# Patient Record
Sex: Female | Born: 1955 | Race: Black or African American | Hispanic: No | Marital: Married | Smoking: Former smoker
Health system: Southern US, Community
[De-identification: ages and names within clinical notes are randomized; demographics above are authoritative.]

## PROBLEM LIST (undated history)

## (undated) DIAGNOSIS — K219 Gastro-esophageal reflux disease without esophagitis: Secondary | ICD-10-CM

## (undated) DIAGNOSIS — E7800 Pure hypercholesterolemia, unspecified: Secondary | ICD-10-CM

## (undated) HISTORY — PX: CHOLECYSTECTOMY: SHX55

## (undated) HISTORY — PX: FOOT SURGERY: SHX648

---

## 1997-12-14 ENCOUNTER — Other Ambulatory Visit: Admission: RE | Admit: 1997-12-14 | Discharge: 1997-12-14 | Payer: Self-pay | Admitting: Gynecology

## 1998-12-18 ENCOUNTER — Ambulatory Visit (HOSPITAL_COMMUNITY): Admission: RE | Admit: 1998-12-18 | Discharge: 1998-12-18 | Payer: Self-pay | Admitting: Family Medicine

## 1998-12-18 ENCOUNTER — Encounter: Payer: Self-pay | Admitting: Family Medicine

## 1999-01-01 ENCOUNTER — Ambulatory Visit (HOSPITAL_COMMUNITY): Admission: RE | Admit: 1999-01-01 | Discharge: 1999-01-01 | Payer: Self-pay | Admitting: Gastroenterology

## 1999-10-01 ENCOUNTER — Other Ambulatory Visit: Admission: RE | Admit: 1999-10-01 | Discharge: 1999-10-01 | Payer: Self-pay | Admitting: Gynecology

## 2000-04-07 ENCOUNTER — Encounter: Admission: RE | Admit: 2000-04-07 | Discharge: 2000-04-07 | Payer: Self-pay | Admitting: Gynecology

## 2000-04-07 ENCOUNTER — Encounter: Payer: Self-pay | Admitting: Gynecology

## 2000-10-19 ENCOUNTER — Encounter (INDEPENDENT_AMBULATORY_CARE_PROVIDER_SITE_OTHER): Payer: Self-pay | Admitting: Specialist

## 2000-10-19 ENCOUNTER — Other Ambulatory Visit: Admission: RE | Admit: 2000-10-19 | Discharge: 2000-10-19 | Payer: Self-pay | Admitting: Gynecology

## 2001-08-10 ENCOUNTER — Encounter: Admission: RE | Admit: 2001-08-10 | Discharge: 2001-08-10 | Payer: Self-pay | Admitting: Gynecology

## 2001-08-10 ENCOUNTER — Encounter: Payer: Self-pay | Admitting: Gynecology

## 2002-09-18 ENCOUNTER — Other Ambulatory Visit: Admission: RE | Admit: 2002-09-18 | Discharge: 2002-09-18 | Payer: Self-pay | Admitting: Gynecology

## 2002-09-26 ENCOUNTER — Encounter: Admission: RE | Admit: 2002-09-26 | Discharge: 2002-09-26 | Payer: Self-pay | Admitting: Gynecology

## 2002-09-26 ENCOUNTER — Encounter: Payer: Self-pay | Admitting: Gynecology

## 2002-11-27 ENCOUNTER — Ambulatory Visit: Admission: RE | Admit: 2002-11-27 | Discharge: 2002-11-27 | Payer: Self-pay | Admitting: Family Medicine

## 2002-11-27 ENCOUNTER — Encounter: Payer: Self-pay | Admitting: Family Medicine

## 2003-10-30 ENCOUNTER — Other Ambulatory Visit: Admission: RE | Admit: 2003-10-30 | Discharge: 2003-10-30 | Payer: Self-pay | Admitting: Gynecology

## 2003-10-30 ENCOUNTER — Encounter: Admission: RE | Admit: 2003-10-30 | Discharge: 2003-10-30 | Payer: Self-pay | Admitting: Gynecology

## 2004-12-16 ENCOUNTER — Other Ambulatory Visit: Admission: RE | Admit: 2004-12-16 | Discharge: 2004-12-16 | Payer: Self-pay | Admitting: Gynecology

## 2004-12-18 ENCOUNTER — Encounter: Admission: RE | Admit: 2004-12-18 | Discharge: 2004-12-18 | Payer: Self-pay | Admitting: Gynecology

## 2005-12-08 ENCOUNTER — Other Ambulatory Visit: Admission: RE | Admit: 2005-12-08 | Discharge: 2005-12-08 | Payer: Self-pay | Admitting: Gynecology

## 2005-12-23 ENCOUNTER — Encounter: Admission: RE | Admit: 2005-12-23 | Discharge: 2005-12-23 | Payer: Self-pay | Admitting: Gynecology

## 2006-12-22 ENCOUNTER — Other Ambulatory Visit: Admission: RE | Admit: 2006-12-22 | Discharge: 2006-12-22 | Payer: Self-pay | Admitting: Gynecology

## 2006-12-23 ENCOUNTER — Emergency Department (HOSPITAL_COMMUNITY): Admission: EM | Admit: 2006-12-23 | Discharge: 2006-12-23 | Payer: Self-pay | Admitting: Emergency Medicine

## 2007-01-28 ENCOUNTER — Encounter: Admission: RE | Admit: 2007-01-28 | Discharge: 2007-01-28 | Payer: Self-pay | Admitting: Gynecology

## 2008-03-13 ENCOUNTER — Other Ambulatory Visit: Admission: RE | Admit: 2008-03-13 | Discharge: 2008-03-13 | Payer: Self-pay | Admitting: Gynecology

## 2008-03-15 ENCOUNTER — Encounter: Admission: RE | Admit: 2008-03-15 | Discharge: 2008-03-15 | Payer: Self-pay | Admitting: Gynecology

## 2008-03-28 ENCOUNTER — Encounter: Admission: RE | Admit: 2008-03-28 | Discharge: 2008-03-28 | Payer: Self-pay | Admitting: Gynecology

## 2009-03-19 ENCOUNTER — Encounter: Admission: RE | Admit: 2009-03-19 | Discharge: 2009-03-19 | Payer: Self-pay | Admitting: Gynecology

## 2010-04-01 ENCOUNTER — Encounter: Admission: RE | Admit: 2010-04-01 | Discharge: 2010-04-01 | Payer: Self-pay | Admitting: Gynecology

## 2011-03-06 ENCOUNTER — Other Ambulatory Visit: Payer: Self-pay | Admitting: Gynecology

## 2011-03-06 DIAGNOSIS — Z1231 Encounter for screening mammogram for malignant neoplasm of breast: Secondary | ICD-10-CM

## 2011-04-06 ENCOUNTER — Ambulatory Visit
Admission: RE | Admit: 2011-04-06 | Discharge: 2011-04-06 | Disposition: A | Discharge status: Home / Self Care | Payer: BC Managed Care – PPO | Source: Ambulatory Visit | Attending: Gynecology | Admitting: Gynecology

## 2011-04-06 DIAGNOSIS — Z1231 Encounter for screening mammogram for malignant neoplasm of breast: Secondary | ICD-10-CM

## 2012-03-04 ENCOUNTER — Other Ambulatory Visit: Payer: Self-pay | Admitting: Gynecology

## 2012-03-04 DIAGNOSIS — Z1231 Encounter for screening mammogram for malignant neoplasm of breast: Secondary | ICD-10-CM

## 2012-04-06 ENCOUNTER — Other Ambulatory Visit: Payer: Self-pay | Admitting: Gynecology

## 2012-04-06 ENCOUNTER — Ambulatory Visit
Admission: RE | Admit: 2012-04-06 | Discharge: 2012-04-06 | Disposition: A | Discharge status: Home / Self Care | Payer: BC Managed Care – PPO | Source: Ambulatory Visit | Attending: Gynecology | Admitting: Gynecology

## 2012-04-06 DIAGNOSIS — Z1231 Encounter for screening mammogram for malignant neoplasm of breast: Secondary | ICD-10-CM

## 2012-04-06 DIAGNOSIS — Z780 Asymptomatic menopausal state: Secondary | ICD-10-CM

## 2012-04-11 ENCOUNTER — Ambulatory Visit
Admission: RE | Admit: 2012-04-11 | Discharge: 2012-04-11 | Disposition: A | Discharge status: Home / Self Care | Payer: BC Managed Care – PPO | Source: Ambulatory Visit | Attending: Gynecology | Admitting: Gynecology

## 2012-04-11 DIAGNOSIS — Z780 Asymptomatic menopausal state: Secondary | ICD-10-CM

## 2013-01-14 ENCOUNTER — Emergency Department (HOSPITAL_COMMUNITY)
Admission: EM | Admit: 2013-01-14 | Discharge: 2013-01-14 | Disposition: A | Discharge status: Home / Self Care | Payer: BC Managed Care – PPO | Attending: Emergency Medicine | Admitting: Emergency Medicine

## 2013-01-14 ENCOUNTER — Emergency Department (HOSPITAL_COMMUNITY): Payer: BC Managed Care – PPO

## 2013-01-14 ENCOUNTER — Encounter (HOSPITAL_COMMUNITY): Payer: Self-pay | Admitting: *Deleted

## 2013-01-14 DIAGNOSIS — R079 Chest pain, unspecified: Secondary | ICD-10-CM

## 2013-01-14 DIAGNOSIS — R071 Chest pain on breathing: Secondary | ICD-10-CM | POA: Insufficient documentation

## 2013-01-14 DIAGNOSIS — E7800 Pure hypercholesterolemia, unspecified: Secondary | ICD-10-CM | POA: Insufficient documentation

## 2013-01-14 DIAGNOSIS — Z79899 Other long term (current) drug therapy: Secondary | ICD-10-CM | POA: Insufficient documentation

## 2013-01-14 DIAGNOSIS — Z87891 Personal history of nicotine dependence: Secondary | ICD-10-CM | POA: Insufficient documentation

## 2013-01-14 DIAGNOSIS — K219 Gastro-esophageal reflux disease without esophagitis: Secondary | ICD-10-CM | POA: Insufficient documentation

## 2013-01-14 HISTORY — DX: Pure hypercholesterolemia, unspecified: E78.00

## 2013-01-14 HISTORY — DX: Gastro-esophageal reflux disease without esophagitis: K21.9

## 2013-01-14 LAB — CBC
MCV: 79.6 fL (ref 78.0–100.0)
Platelets: 282 10*3/uL (ref 150–400)
RBC: 4.85 MIL/uL (ref 3.87–5.11)
RDW: 14 % (ref 11.5–15.5)

## 2013-01-14 LAB — BASIC METABOLIC PANEL
CO2: 27 mEq/L (ref 19–32)
Chloride: 103 mEq/L (ref 96–112)
Glucose, Bld: 117 mg/dL — ABNORMAL HIGH (ref 70–99)
Sodium: 139 mEq/L (ref 135–145)

## 2013-01-14 MED ORDER — IBUPROFEN 600 MG PO TABS: 600.0000 mg | ORAL_TABLET | Freq: Three times a day (TID) | ORAL | Status: AC

## 2013-01-14 NOTE — ED Notes (Addendum)
Pt reports contracting sharp central/epigastric CP that started on Thursday. Pain free at this time. Reports left arm "tingling" that started last night. PSH of cholecystectomy.

## 2013-01-14 NOTE — ED Provider Notes (Signed)
History     CSN: [id number]  Arrival date & time 01/14/13  8295   First MD Initiated Contact with Patient 01/14/13 2018      Chief Complaint  Patient presents with  . Chest Pain    (Consider location/radiation/quality/duration/timing/severity/associated sxs/prior treatment) HPI Patient presents with intermittent chest pain for 4 days. Initially the pain was in both breasts, but over the interval time has become about the left upper chest, left shoulder blade, with radiation to the left upper arm.  The pain occurs and stops with no clear precipitant or intervention. During pain episodes, there is no associated dyspnea, nor any pleuritic or exertional changes. There is no associated lightheadedness, syncope, fever, chills, cough. The patient saw her physician at the beginning of this illness. She has no known history of heart disease, does not smoke.  Past Medical History  Diagnosis Date  . High cholesterol   . GERD (gastroesophageal reflux disease)     Past Surgical History  Procedure Laterality Date  . Cholecystectomy    . Foot surgery      bilateral feet    No family history on file.  History  Substance Use Topics  . Smoking status: Former Games developer  . Smokeless tobacco: Not on file  . Alcohol Use: Yes    OB History   Grav Para Term Preterm Abortions TAB SAB Ect Mult Living                  Review of Systems  Constitutional:       Per HPI, otherwise negative  HENT:       Per HPI, otherwise negative  Respiratory:       Per HPI, otherwise negative  Cardiovascular:       Per HPI, otherwise negative  Gastrointestinal: Negative for vomiting.  Endocrine:       Negative aside from HPI  Genitourinary:       Neg aside from HPI   Musculoskeletal:       Per HPI, otherwise negative  Skin: Negative.   Neurological: Negative for syncope.    Allergies  Review of patient's allergies indicates no known allergies.  Home Medications   Current Outpatient Rx   Name  Route  Sig  Dispense  Refill  . omeprazole (PRILOSEC OTC) 20 MG tablet   Oral   Take 20 mg by mouth daily.           BP 163/95  Pulse 103  Temp(Src) 98.4 F (36.9 C) (Oral)  Resp 14  SpO2 98%  Physical Exam  Nursing note and vitals reviewed. Constitutional: She is oriented to person, place, and time. She appears well-developed and well-nourished. No distress.  HENT:  Head: Normocephalic and atraumatic.  Eyes: Conjunctivae and EOM are normal.  Neck: No rigidity. No edema present.  Cardiovascular: Normal rate and regular rhythm.   Pulmonary/Chest: Effort normal and breath sounds normal. No stridor. No respiratory distress. Chest wall is not dull to percussion. She exhibits no tenderness and no bony tenderness.  Abdominal: She exhibits no distension.  Musculoskeletal: She exhibits no edema.       Left shoulder: Normal.       Left elbow: Normal.  Neurological: She is alert and oriented to person, place, and time. No cranial nerve deficit. She exhibits normal muscle tone. Coordination normal.  Skin: Skin is warm and dry.  Psychiatric: She has a normal mood and affect.    ED Course  Procedures (including critical care time)  Labs Reviewed  BASIC METABOLIC PANEL - Abnormal; Notable for the following:    Glucose, Bld 117 (*)    All other components within normal limits  CBC  POCT I-STAT TROPONIN I   Dg Chest 2 View  01/14/2013  *RADIOLOGY REPORT*  Clinical Data: Chest, left back and left shoulder pain, pain and numbness left arm, history GERD, smoking  CHEST - 2 VIEW  Comparison: None  Findings: Normal heart size, mediastinal contours, and pulmonary vascularity. Atherosclerotic calcification aortic arch. Lungs appear mildly hyperinflated but clear. No pleural effusion or pneumothorax. Surgical clips right upper quadrant question cholecystectomy. No acute osseous abnormalities.  IMPRESSION: No definite acute abnormalities.   Original Report Authenticated By: Ulyses Southward,  M.D.      No diagnosis found.   Date: 01/14/2013  Rate: 96  Rhythm: normal sinus rhythm  QRS Axis: normal  Intervals: normal  ST/T Wave abnormalities: normal  Conduction Disutrbances: none  Narrative Interpretation: unremarkable      MDM  This generally well-appearing female presents with days of inconsistent, intermittent left-sided chest pain. On exam she is awake and alert, hemodynamic stable, with no active pain. Given the presence of pain for several days, fixed component likely represents accurate reflection of possible coronary ischemia, and this is negative/reassuring. The patient has a nonischemic EKG, minimal risk factors for ACS, and given the reassuring evaluation, the absence of ongoing complaints, she was appropriate for discharge with primary care followup. She has a primary care physician. Importance of following up for further evaluation and management was reiterated to her, though there is low suspicion for occult ACS or other acute pathology.         Gerhard Munch, MD 01/14/13 2209

## 2013-01-14 NOTE — ED Notes (Signed)
NAD noted at time of d/c home 

## 2013-01-14 NOTE — ED Notes (Signed)
Intermittent cp off/on since Thursday. Lt. Sided cp, non re-producible. Lt. Arm hurting, and upper back. Pain subsiding. No other symptoms.

## 2013-01-14 NOTE — ED Notes (Signed)
Pt to XR

## 2013-03-13 ENCOUNTER — Other Ambulatory Visit: Payer: Self-pay

## 2013-03-13 DIAGNOSIS — Z1231 Encounter for screening mammogram for malignant neoplasm of breast: Secondary | ICD-10-CM

## 2013-04-07 ENCOUNTER — Ambulatory Visit
Admission: RE | Admit: 2013-04-07 | Discharge: 2013-04-07 | Disposition: A | Discharge status: Home / Self Care | Payer: BC Managed Care – PPO | Source: Ambulatory Visit

## 2013-04-07 DIAGNOSIS — Z1231 Encounter for screening mammogram for malignant neoplasm of breast: Secondary | ICD-10-CM

## 2014-03-07 ENCOUNTER — Other Ambulatory Visit: Payer: Self-pay

## 2014-03-07 DIAGNOSIS — Z1231 Encounter for screening mammogram for malignant neoplasm of breast: Secondary | ICD-10-CM

## 2014-04-09 ENCOUNTER — Ambulatory Visit
Admission: RE | Admit: 2014-04-09 | Discharge: 2014-04-09 | Disposition: A | Discharge status: Home / Self Care | Payer: BC Managed Care – PPO | Source: Ambulatory Visit

## 2014-04-09 DIAGNOSIS — Z1231 Encounter for screening mammogram for malignant neoplasm of breast: Secondary | ICD-10-CM

## 2015-03-12 ENCOUNTER — Other Ambulatory Visit: Payer: Self-pay

## 2015-03-12 DIAGNOSIS — Z1231 Encounter for screening mammogram for malignant neoplasm of breast: Secondary | ICD-10-CM

## 2015-04-12 ENCOUNTER — Ambulatory Visit
Admission: RE | Admit: 2015-04-12 | Discharge: 2015-04-12 | Disposition: A | Discharge status: Home / Self Care | Payer: BC Managed Care – PPO | Source: Ambulatory Visit

## 2015-04-12 DIAGNOSIS — Z1231 Encounter for screening mammogram for malignant neoplasm of breast: Secondary | ICD-10-CM

## 2015-09-17 ENCOUNTER — Ambulatory Visit
Admission: RE | Admit: 2015-09-17 | Discharge: 2015-09-17 | Disposition: A | Discharge status: Home / Self Care | Payer: BC Managed Care – PPO | Source: Ambulatory Visit | Attending: Family Medicine | Admitting: Family Medicine

## 2015-09-17 ENCOUNTER — Other Ambulatory Visit: Payer: Self-pay | Admitting: Family Medicine

## 2015-09-17 DIAGNOSIS — M25551 Pain in right hip: Secondary | ICD-10-CM

## 2016-03-06 ENCOUNTER — Other Ambulatory Visit: Payer: Self-pay | Admitting: Obstetrics & Gynecology

## 2016-03-06 DIAGNOSIS — Z1231 Encounter for screening mammogram for malignant neoplasm of breast: Secondary | ICD-10-CM

## 2016-04-14 ENCOUNTER — Ambulatory Visit: Payer: BC Managed Care – PPO

## 2016-04-15 ENCOUNTER — Ambulatory Visit
Admission: RE | Admit: 2016-04-15 | Discharge: 2016-04-15 | Disposition: A | Discharge status: Home / Self Care | Payer: BC Managed Care – PPO | Source: Ambulatory Visit | Attending: Obstetrics & Gynecology | Admitting: Obstetrics & Gynecology

## 2016-04-15 DIAGNOSIS — Z1231 Encounter for screening mammogram for malignant neoplasm of breast: Secondary | ICD-10-CM

## 2016-09-01 ENCOUNTER — Ambulatory Visit
Admission: RE | Admit: 2016-09-01 | Discharge: 2016-09-01 | Disposition: A | Discharge status: Home / Self Care | Payer: BC Managed Care – PPO | Source: Ambulatory Visit | Attending: Family Medicine | Admitting: Family Medicine

## 2016-09-01 ENCOUNTER — Other Ambulatory Visit: Payer: Self-pay | Admitting: Family Medicine

## 2016-09-01 DIAGNOSIS — M779 Enthesopathy, unspecified: Principal | ICD-10-CM

## 2016-09-01 DIAGNOSIS — M778 Other enthesopathies, not elsewhere classified: Secondary | ICD-10-CM

## 2017-03-08 ENCOUNTER — Other Ambulatory Visit: Payer: Self-pay | Admitting: Obstetrics & Gynecology

## 2017-03-08 DIAGNOSIS — Z1231 Encounter for screening mammogram for malignant neoplasm of breast: Secondary | ICD-10-CM

## 2017-04-15 ENCOUNTER — Other Ambulatory Visit: Payer: Self-pay | Admitting: Family Medicine

## 2017-04-15 DIAGNOSIS — E2839 Other primary ovarian failure: Secondary | ICD-10-CM

## 2017-04-16 ENCOUNTER — Other Ambulatory Visit: Payer: Self-pay | Admitting: Family Medicine

## 2017-04-16 ENCOUNTER — Ambulatory Visit
Admission: RE | Admit: 2017-04-16 | Discharge: 2017-04-16 | Disposition: A | Discharge status: Home / Self Care | Payer: BC Managed Care – PPO | Source: Ambulatory Visit | Attending: Family Medicine | Admitting: Family Medicine

## 2017-04-16 ENCOUNTER — Other Ambulatory Visit: Payer: BC Managed Care – PPO

## 2017-04-16 ENCOUNTER — Ambulatory Visit
Admission: RE | Admit: 2017-04-16 | Discharge: 2017-04-16 | Disposition: A | Discharge status: Home / Self Care | Payer: BC Managed Care – PPO | Source: Ambulatory Visit | Attending: Obstetrics & Gynecology | Admitting: Obstetrics & Gynecology

## 2017-04-16 DIAGNOSIS — M79645 Pain in left finger(s): Secondary | ICD-10-CM

## 2017-04-16 DIAGNOSIS — Z1231 Encounter for screening mammogram for malignant neoplasm of breast: Secondary | ICD-10-CM

## 2017-04-16 DIAGNOSIS — E2839 Other primary ovarian failure: Secondary | ICD-10-CM

## 2017-04-16 DIAGNOSIS — M79644 Pain in right finger(s): Secondary | ICD-10-CM

## 2017-11-23 DIAGNOSIS — R490 Dysphonia: Secondary | ICD-10-CM | POA: Insufficient documentation

## 2017-11-23 DIAGNOSIS — K219 Gastro-esophageal reflux disease without esophagitis: Secondary | ICD-10-CM | POA: Insufficient documentation

## 2017-11-23 DIAGNOSIS — J3489 Other specified disorders of nose and nasal sinuses: Secondary | ICD-10-CM | POA: Insufficient documentation

## 2017-12-28 ENCOUNTER — Other Ambulatory Visit: Payer: Self-pay | Admitting: Family Medicine

## 2017-12-28 ENCOUNTER — Ambulatory Visit
Admission: RE | Admit: 2017-12-28 | Discharge: 2017-12-28 | Disposition: A | Discharge status: Home / Self Care | Payer: BC Managed Care – PPO | Source: Ambulatory Visit | Attending: Family Medicine | Admitting: Family Medicine

## 2017-12-28 DIAGNOSIS — M25551 Pain in right hip: Secondary | ICD-10-CM

## 2018-01-17 DIAGNOSIS — M7061 Trochanteric bursitis, right hip: Secondary | ICD-10-CM | POA: Insufficient documentation

## 2018-03-14 ENCOUNTER — Other Ambulatory Visit: Payer: Self-pay | Admitting: Obstetrics & Gynecology

## 2018-03-14 DIAGNOSIS — Z1231 Encounter for screening mammogram for malignant neoplasm of breast: Secondary | ICD-10-CM

## 2018-04-19 ENCOUNTER — Ambulatory Visit
Admission: RE | Admit: 2018-04-19 | Discharge: 2018-04-19 | Disposition: A | Discharge status: Home / Self Care | Payer: BC Managed Care – PPO | Source: Ambulatory Visit | Attending: Obstetrics & Gynecology | Admitting: Obstetrics & Gynecology

## 2018-04-19 DIAGNOSIS — Z1231 Encounter for screening mammogram for malignant neoplasm of breast: Secondary | ICD-10-CM

## 2018-04-20 ENCOUNTER — Other Ambulatory Visit: Payer: Self-pay | Admitting: Obstetrics & Gynecology

## 2018-04-20 DIAGNOSIS — R928 Other abnormal and inconclusive findings on diagnostic imaging of breast: Secondary | ICD-10-CM

## 2018-04-21 ENCOUNTER — Ambulatory Visit
Admission: RE | Admit: 2018-04-21 | Discharge: 2018-04-21 | Disposition: A | Discharge status: Home / Self Care | Payer: BC Managed Care – PPO | Source: Ambulatory Visit | Attending: Obstetrics & Gynecology | Admitting: Obstetrics & Gynecology

## 2018-04-21 ENCOUNTER — Ambulatory Visit: Payer: BC Managed Care – PPO

## 2018-04-21 DIAGNOSIS — R928 Other abnormal and inconclusive findings on diagnostic imaging of breast: Secondary | ICD-10-CM

## 2019-04-14 ENCOUNTER — Other Ambulatory Visit: Payer: Self-pay | Admitting: Family Medicine

## 2019-04-14 DIAGNOSIS — Z1231 Encounter for screening mammogram for malignant neoplasm of breast: Secondary | ICD-10-CM

## 2019-05-22 ENCOUNTER — Other Ambulatory Visit: Payer: Self-pay

## 2019-05-22 ENCOUNTER — Ambulatory Visit (INDEPENDENT_AMBULATORY_CARE_PROVIDER_SITE_OTHER): Payer: BC Managed Care – PPO

## 2019-05-22 ENCOUNTER — Other Ambulatory Visit: Payer: Self-pay | Admitting: Podiatry

## 2019-05-22 ENCOUNTER — Encounter: Payer: Self-pay | Admitting: Podiatry

## 2019-05-22 ENCOUNTER — Ambulatory Visit: Payer: BC Managed Care – PPO | Admitting: Podiatry

## 2019-05-22 VITALS — BP 129/91 | HR 90 | Resp 16

## 2019-05-22 DIAGNOSIS — M7662 Achilles tendinitis, left leg: Secondary | ICD-10-CM

## 2019-05-22 DIAGNOSIS — L84 Corns and callosities: Secondary | ICD-10-CM

## 2019-05-22 DIAGNOSIS — L603 Nail dystrophy: Secondary | ICD-10-CM | POA: Diagnosis not present

## 2019-05-22 DIAGNOSIS — M7661 Achilles tendinitis, right leg: Secondary | ICD-10-CM | POA: Diagnosis not present

## 2019-05-22 DIAGNOSIS — L853 Xerosis cutis: Secondary | ICD-10-CM

## 2019-05-22 DIAGNOSIS — M722 Plantar fascial fibromatosis: Secondary | ICD-10-CM

## 2019-05-22 MED ORDER — KETOCONAZOLE 2 % EX CREA: 1.0000 "application " | TOPICAL_CREAM | Freq: Every day | CUTANEOUS | 2 refills | Status: AC

## 2019-05-22 MED ORDER — DICLOFENAC SODIUM 75 MG PO TBEC: 75.0000 mg | DELAYED_RELEASE_TABLET | Freq: Two times a day (BID) | ORAL | 0 refills | Status: AC

## 2019-05-29 ENCOUNTER — Other Ambulatory Visit: Payer: Self-pay

## 2019-05-29 ENCOUNTER — Ambulatory Visit
Admission: RE | Admit: 2019-05-29 | Discharge: 2019-05-29 | Disposition: A | Discharge status: Home / Self Care | Payer: BC Managed Care – PPO | Source: Ambulatory Visit | Attending: Family Medicine | Admitting: Family Medicine

## 2019-05-29 DIAGNOSIS — Z1231 Encounter for screening mammogram for malignant neoplasm of breast: Secondary | ICD-10-CM

## 2019-05-31 ENCOUNTER — Ambulatory Visit: Payer: BC Managed Care – PPO

## 2019-06-02 NOTE — Progress Notes (Signed)
Subjective:   Patient ID: Daisy French, female   DOB: 63 y.o.   MRN: [id number]   HPI 63 year old female presents the office today for concerns of her left big toenail becoming darkened and is been on the last couple of years.  She is here dermatology previously prescribed topical medications.  She said the nail occasionally becomes uncomfortable.  Denies any drainage or pus.  She states that the nail this morning becomes brittle.  She also states that she is getting pain to both of her heels on the back which is been aching the last 6 to 7 months.  The right side is worse than left.  Occasional swelling localized to the area but no injury.  Pain is intermittent.  She also describes flaking skin on the right foot is peeling.  She is tried over-the-counter medications.  She also gets calluses on both of her feet on the forefoot area.   Review of Systems  All other systems reviewed and are negative.  Past Medical History:  Diagnosis Date  . GERD (gastroesophageal reflux disease)   . High cholesterol     Past Surgical History:  Procedure Laterality Date  . CHOLECYSTECTOMY    . FOOT SURGERY     bilateral feet     Current Outpatient Medications:  .  diclofenac (VOLTAREN) 75 MG EC tablet, Take 1 tablet (75 mg total) by mouth 2 (two) times daily., Disp: 30 tablet, Rfl: 0 .  ketoconazole (NIZORAL) 2 % cream, Apply 1 application topically daily., Disp: 60 g, Rfl: 2 .  pravastatin (PRAVACHOL) 40 MG tablet, , Disp: , Rfl:   No Known Allergies       Objective:  Physical Exam  General: AAO x3, NAD  Dermatological: Left hallux nails hypertrophic, dystrophic with some dark discoloration of the nail.  This is also present on the other toenails as well.  No extension of any hyperpigmentation into the surrounding skin.  Dry skin present bilaterally but there is no evidence of tinea pedis.  Mild hyperkeratotic tissue present submetatarsal area bilaterally.  Vascular: Dorsalis Pedis artery  and Posterior Tibial artery pedal pulses are 2/4 bilateral with immedate capillary fill time. Pedal hair growth present. No varicosities and no lower extremity edema present bilateral. There is no pain with calf compression, swelling, warmth, erythema.   Neruologic: Grossly intact via light touch bilateral.Protective threshold with Semmes Wienstein monofilament intact to all pedal sites bilateral.  Musculoskeletal: Mild tenderness palpation posterior aspect of bilateral heels along the distal portion Achilles tendon insertion into the calcaneus.  Thompson test is negative.  Equinus is present.  No pain while partially calcaneus or the plantar fascia.  No other areas of tenderness.  Muscular strength 5/5 in all groups tested bilateral.  Gait: Unassisted, Nonantalgic.       Assessment:   Onychodystrophy, likely onychomycosis, dry skin, hyperkeratotic lesions, posterior heel pain, heel spur/tendinitis    Plan:  -Treatment options discussed including all alternatives, risks, and complications -Etiology of symptoms were discussed -X-rays were obtained and reviewed with the patient. Posterior calcaneal spurring is present.  No evidence of acute fracture. -Regards to the toenails I sharply debrided the nails without any complications or bleeding I sent this for culture, pathology to [place] -Debrided hyperkeratotic tissue without any complications or bleeding. -Moisturizer daily to the callus as well as dry skin -For the posterior heel pain prescribed Voltaren gel.  Ice to the area.  Stretching, icing daily.  Heel lift.  Night splint.

## 2019-06-12 ENCOUNTER — Other Ambulatory Visit: Payer: Self-pay

## 2019-06-12 ENCOUNTER — Ambulatory Visit: Payer: BC Managed Care – PPO | Admitting: Orthotics

## 2019-06-12 ENCOUNTER — Encounter (INDEPENDENT_AMBULATORY_CARE_PROVIDER_SITE_OTHER): Payer: Self-pay

## 2019-06-12 DIAGNOSIS — M7662 Achilles tendinitis, left leg: Secondary | ICD-10-CM

## 2019-06-12 DIAGNOSIS — M7661 Achilles tendinitis, right leg: Secondary | ICD-10-CM

## 2019-06-12 NOTE — Progress Notes (Signed)
Patient came in today to pick up custom made foot orthotics.  The goals were accomplished and the patient reported no dissatisfaction with said orthotics.  Patient was advised of breakin period and how to report any issues. 

## 2019-08-07 ENCOUNTER — Telehealth: Payer: Self-pay | Admitting: Podiatry

## 2019-08-07 NOTE — Telephone Encounter (Signed)
Called patient and left a voicemail for her to call back and schedule an appt to discuss Fungal Culture Results per Dr. Jacqualyn Posey.

## 2019-08-24 ENCOUNTER — Other Ambulatory Visit: Payer: Self-pay

## 2019-08-24 ENCOUNTER — Ambulatory Visit: Payer: BC Managed Care – PPO | Admitting: Podiatry

## 2019-08-24 DIAGNOSIS — L608 Other nail disorders: Secondary | ICD-10-CM

## 2019-08-24 DIAGNOSIS — L603 Nail dystrophy: Secondary | ICD-10-CM

## 2019-08-27 NOTE — Progress Notes (Signed)
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  I want to have her come back to the office today to further discuss treatment options.  She states that she previously had pain in the knee when she first came in the office however she has not had any pain to the nail and she denies any drainage.  The nail has not worsened in color since I last saw her.  She did previously see dermatology about 1 year ago for the same issue when she was prescribed Jublia but she said that she did not take this on a regular basis.  At that time she was told she had fungus.  Denies any systemic complaints such as fevers, chills, nausea, vomiting. No acute changes since last appointment, and no other complaints at this time.   Objective: AAO x3, NAD DP/PT pulses palpable bilaterally, CRT less than 3 seconds On the left hallux toenail there is longitudinal hyperpigmented streak.  There is no extension of any hyperpigmentation of the surrounding skin.  There is no pain in the nail there is no redness or drainage or any signs of infection.  The nail is also discolored with yellow-brown discoloration although mild.  No open lesions or pre-ulcerative lesions.  No pain in the Achilles tendons.  No pain to the heels.  No other areas of discomfort. No pain with calf compression, swelling, warmth, erythema  Assessment: Left hallux toenail discoloration  Plan: -All treatment options discussed with the patient including all alternatives, risks, complications.  -I reviewed the nail biopsy results. [place] stain demonstrated melanin pigment.  Although likely benign the biopsy could not rule out any underlying melanotic lesion.  Because of this I strongly encouraged biopsy for excision of the nail.  This took her by shock today and we discussed options.  Also discussed possible referral to further specialist.   After discussion she wants to proceed with biopsy but she wants to schedule this for next week.  I will schedule this at her convenience.  We also discussed treatment options for the nail fungus however we will hold off on nail fungus treatment the left hallux toenail until further biopsy. -Can prescribe compound cream from [place] and exclude the urea for the other toenails. -Patient encouraged to call the office with any questions, concerns, change in symptoms.   Trula Slade DPM

## 2019-09-05 ENCOUNTER — Ambulatory Visit: Payer: BC Managed Care – PPO | Admitting: Podiatry

## 2019-09-05 ENCOUNTER — Telehealth: Payer: Self-pay | Admitting: Podiatry

## 2019-09-05 ENCOUNTER — Other Ambulatory Visit: Payer: Self-pay

## 2019-09-05 DIAGNOSIS — L608 Other nail disorders: Secondary | ICD-10-CM

## 2019-09-05 MED ORDER — HYDROCODONE-ACETAMINOPHEN 5-325 MG PO TABS: 1.0000 | ORAL_TABLET | Freq: Four times a day (QID) | ORAL | 0 refills | Status: AC | PRN

## 2019-09-05 NOTE — Telephone Encounter (Signed)
Pt called back to see if there was an ointment that was suppose to be given for a fungus and some bandages

## 2019-09-05 NOTE — Addendum Note (Signed)
Addended by: Cranford Mon R on: 09/05/2019 01:06 PM   Modules accepted: Orders

## 2019-09-05 NOTE — Progress Notes (Signed)
Subjective: 63 year old female presents the office today for concerns about evaluation of left big toenail discoloration as well as discomfort.  She states that she does get pain around the nail borders and she states the nails not healthy.  She has been using antifungal medicine which is been helpful.  She still has a black vertical streak within the nail.  She said that she is been dealing with this issue for last few years.  She also was previously on Jublia for nail fungus but she does not take it on a regular basis.  Previous culture did show melanin pigment cannot rule out underlying melanotic disease. Denies any systemic complaints such as fevers, chills, nausea, vomiting. No acute changes since last appointment, and no other complaints at this time.   Objective: AAO x3, NAD DP/PT pulses palpable bilaterally, CRT less than 3 seconds Left hallux toenail is dystrophic with yellow-brown discoloration.there is some vertical streak present around the nail which is hyperpigmented.  There is tenderness she states around toenail itself.  No redness or drainage or signs of infection.  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  -Today given her long discussion regards to treatment options.  I do recommend biopsy.  As the entire nail is painful discussed with her nail removal as well as a punch biopsy of the proximal nail fold.  On exam risks and complications she was to proceed and surgical consent form signed. -Toe was anesthetized after the skin was cleaned alcohol with 3 cc of lidocaine, Marcaine plain.  Toe was then prepped in sterile fashion a tourniquet applied.  The toenail was freed from the nail fold and was removed in toto.  On the proximal aspect of the nail fold where there was a hyperpigmented streak a punch biopsy  was utilized to remove this portion of the nail fold.  This was sent to pathology as she is separate specimens to rule out melanoma. Specimen was given to Cranford Mon, [place].  Area cleaned with alcohol and Silvadene was applied followed by dressing.  Tourniquet was released no sign of any immediate capillary fill time of the digit. -Vicodin prescribed -Right ankle pain  Return in about 10 days (around 09/15/2019).  Trula Slade DPM

## 2019-09-05 NOTE — Addendum Note (Signed)
Addended by: Cranford Mon R on: 09/05/2019 02:54 PM   Modules accepted: Orders

## 2019-09-06 ENCOUNTER — Telehealth: Payer: Self-pay | Admitting: *Deleted

## 2019-09-06 NOTE — Telephone Encounter (Signed)
Sent the fedex bag to [facility] and the tracking number is [id number] and the [id number]. Lattie Haw

## 2019-09-07 NOTE — Telephone Encounter (Signed)
I would hold off on fungus medication on the nail that had the procedure. Would apply a small amount of antibiotic ointment to the nail and cover with a band-aid or she can use guaze if needed

## 2019-09-07 NOTE — Telephone Encounter (Signed)
Dr Jacqualyn Posey, called patient and relayed the message. Daisy French

## 2019-09-07 NOTE — Telephone Encounter (Signed)
Dr Wagoner, Please advise. Thanks Carren Blakley 

## 2019-09-13 ENCOUNTER — Telehealth: Payer: Self-pay | Admitting: Podiatry

## 2019-09-13 NOTE — Telephone Encounter (Signed)
Received biopsy results. No signs of melanoma. Went over the results and discussed fungus. She is doing well and the toe is healing without issue after undergoing avulsion. Follow up as scheduled or encouraged to call with any questions or concerns.

## 2019-09-19 ENCOUNTER — Other Ambulatory Visit: Payer: Self-pay

## 2019-09-19 ENCOUNTER — Ambulatory Visit: Payer: BC Managed Care – PPO | Admitting: Podiatry

## 2019-09-19 DIAGNOSIS — L608 Other nail disorders: Secondary | ICD-10-CM

## 2019-09-19 DIAGNOSIS — L603 Nail dystrophy: Secondary | ICD-10-CM

## 2019-09-19 NOTE — Patient Instructions (Signed)

## 2019-09-27 NOTE — Progress Notes (Signed)
Subjective: Daisy French is a 64 y.o.  female returns to office today for follow up evaluation after having left Hallux total nail avulsion performed/biopsy. Patient has been soaking using epsom salts and applying topical antibiotic covered with bandaid daily.  She states that she is doing well denies any significant discomfort and she denies any drainage or pus or any redness or swelling.  Patient denies fevers, chills, nausea, vomiting. Denies any calf pain, chest pain, SOB.   Objective:  Vitals: Reviewed  General: Well developed, nourished, in no acute distress, alert and oriented x3   Dermatology: Skin is warm, dry and supple bilateral. LEFT  hallux nail border appears to be clean, dry, with mild granular tissue and surrounding scab. There is no surrounding erythema, edema, drainage/purulence. The remaining nails appear unremarkable at this time. There are no other lesions or other signs of infection present.  Neurovascular status: Intact. No lower extremity swelling; No pain with calf compression bilateral.  Musculoskeletal: Minimal tenderness to palpation of the left  hallux nail bed. Muscular strength within normal limits bilateral.   Assesement and Plan: S/p partial nail avulsion, doing well.   -Continue soaking in epsom salts twice a day followed by antibiotic ointment and a band-aid. Can leave uncovered at night. Continue this until completely healed.  -If the area has not healed in 2 weeks, call the office for follow-up appointment, or sooner if any problems arise.  -Again reviewed the biopsy results.  There is no evidence of melanoma at this time.  Need to continue to monitor for discussed symptoms to look for in regards to melanoma. -Monitor for any signs/symptoms of infection. Call the office immediately if any occur or go directly to the emergency room. Call with any questions/concerns.  Ovid Curd, DPM

## 2019-11-04 ENCOUNTER — Ambulatory Visit: Payer: BC Managed Care – PPO | Attending: Internal Medicine

## 2019-11-04 DIAGNOSIS — Z23 Encounter for immunization: Secondary | ICD-10-CM | POA: Insufficient documentation

## 2019-11-04 NOTE — Progress Notes (Signed)
   Covid-19 Vaccination Clinic  Name:  Daisy French    MRN: [id number] DOB: February 17, 1956  11/04/2019  [name] was observed post Covid-19 immunization for 15 minutes without incidence. She was provided with Vaccine Information Sheet and instruction to access the V-Safe system.   [name] was instructed to call 911 with any severe reactions post vaccine: Marland Kitchen Difficulty breathing  . Swelling of your face and throat  . A fast heartbeat  . A bad rash all over your body  . Dizziness and weakness    Immunizations Administered    Name Date Dose VIS Date Route   Pfizer COVID-19 Vaccine 11/04/2019  5:34 PM 0.3 mL 08/18/2019 Intramuscular   Manufacturer: [employer], [employer]   Lot: [id number]   NDC: [id number]

## 2019-11-25 ENCOUNTER — Ambulatory Visit: Payer: BC Managed Care – PPO | Attending: Internal Medicine

## 2019-11-25 DIAGNOSIS — Z23 Encounter for immunization: Secondary | ICD-10-CM

## 2019-11-25 NOTE — Progress Notes (Signed)
   Covid-19 Vaccination Clinic  Name:  Adlee Paar    MRN: [id number] DOB: [birth year]  11/25/2019  Ms. Bye was observed post Covid-19 immunization for 15 minutes without incident. She was provided with Vaccine Information Sheet and instruction to access the V-Safe system.   Ms. Fassnacht was instructed to call 911 with any severe reactions post vaccine: Marland Kitchen Difficulty breathing  . Swelling of face and throat  . A fast heartbeat  . A bad rash all over body  . Dizziness and weakness   Immunizations Administered    Name Date Dose VIS Date Route   Pfizer COVID-19 Vaccine 11/25/2019  4:44 PM 0.3 mL 08/18/2019 Intramuscular   Manufacturer: [employer], [employer]   Lot: [id number]   NDC: [address]-2

## 2020-04-02 ENCOUNTER — Other Ambulatory Visit: Payer: Self-pay | Admitting: Family Medicine

## 2020-04-02 DIAGNOSIS — Z1231 Encounter for screening mammogram for malignant neoplasm of breast: Secondary | ICD-10-CM

## 2020-06-03 ENCOUNTER — Ambulatory Visit: Payer: BC Managed Care – PPO

## 2020-06-21 ENCOUNTER — Other Ambulatory Visit: Payer: Self-pay

## 2020-06-21 ENCOUNTER — Ambulatory Visit
Admission: RE | Admit: 2020-06-21 | Discharge: 2020-06-21 | Disposition: A | Discharge status: Home / Self Care | Payer: BC Managed Care – PPO | Source: Ambulatory Visit | Attending: Family Medicine | Admitting: Family Medicine

## 2020-06-21 DIAGNOSIS — Z1231 Encounter for screening mammogram for malignant neoplasm of breast: Secondary | ICD-10-CM

## 2020-07-30 ENCOUNTER — Ambulatory Visit
Admission: RE | Admit: 2020-07-30 | Discharge: 2020-07-30 | Disposition: A | Discharge status: Home / Self Care | Payer: BC Managed Care – PPO | Source: Ambulatory Visit | Attending: Physician Assistant | Admitting: Physician Assistant

## 2020-07-30 ENCOUNTER — Other Ambulatory Visit: Payer: Self-pay | Admitting: Physician Assistant

## 2020-07-30 DIAGNOSIS — R2242 Localized swelling, mass and lump, left lower limb: Secondary | ICD-10-CM

## 2020-07-31 ENCOUNTER — Other Ambulatory Visit: Payer: Self-pay | Admitting: Physician Assistant

## 2020-07-31 DIAGNOSIS — R2242 Localized swelling, mass and lump, left lower limb: Secondary | ICD-10-CM

## 2020-08-03 ENCOUNTER — Ambulatory Visit
Admission: RE | Admit: 2020-08-03 | Discharge: 2020-08-03 | Disposition: A | Discharge status: Home / Self Care | Payer: BC Managed Care – PPO | Source: Ambulatory Visit | Attending: Physician Assistant | Admitting: Physician Assistant

## 2020-08-03 ENCOUNTER — Other Ambulatory Visit: Payer: Self-pay

## 2020-08-03 DIAGNOSIS — R2242 Localized swelling, mass and lump, left lower limb: Secondary | ICD-10-CM

## 2020-08-03 MED ORDER — GADOBENATE DIMEGLUMINE 529 MG/ML IV SOLN
15.0000 mL | Freq: Once | INTRAVENOUS | Status: AC | PRN
  Administered 2020-08-03: 15 mL via INTRAVENOUS

## 2020-10-22 ENCOUNTER — Ambulatory Visit: Payer: BC Managed Care – PPO | Admitting: Internal Medicine

## 2021-05-22 ENCOUNTER — Other Ambulatory Visit: Payer: Self-pay | Admitting: Family Medicine

## 2021-05-22 DIAGNOSIS — Z1231 Encounter for screening mammogram for malignant neoplasm of breast: Secondary | ICD-10-CM

## 2021-06-27 ENCOUNTER — Other Ambulatory Visit: Payer: Self-pay

## 2021-06-27 ENCOUNTER — Ambulatory Visit
Admission: RE | Admit: 2021-06-27 | Discharge: 2021-06-27 | Disposition: A | Discharge status: Home / Self Care | Payer: Self-pay | Source: Ambulatory Visit | Attending: Family Medicine | Admitting: Family Medicine

## 2021-06-27 DIAGNOSIS — Z1231 Encounter for screening mammogram for malignant neoplasm of breast: Secondary | ICD-10-CM

## 2022-03-04 ENCOUNTER — Other Ambulatory Visit: Payer: Self-pay | Admitting: Family Medicine

## 2022-03-04 DIAGNOSIS — E2839 Other primary ovarian failure: Secondary | ICD-10-CM

## 2022-04-01 ENCOUNTER — Other Ambulatory Visit: Payer: Self-pay | Admitting: Family Medicine

## 2022-04-01 DIAGNOSIS — Z1231 Encounter for screening mammogram for malignant neoplasm of breast: Secondary | ICD-10-CM

## 2022-04-28 ENCOUNTER — Ambulatory Visit
Admission: RE | Admit: 2022-04-28 | Discharge: 2022-04-28 | Disposition: A | Discharge status: Home / Self Care | Payer: Medicare Other | Source: Ambulatory Visit | Attending: Family Medicine | Admitting: Family Medicine

## 2022-04-28 DIAGNOSIS — E2839 Other primary ovarian failure: Secondary | ICD-10-CM

## 2022-05-12 IMAGING — MG MM DIGITAL SCREENING BILAT W/ TOMO AND CAD
6 of 12 series · 6 of 36 positions shown · non-contrast
Comparison: Previous exam(s).

CLINICAL DATA: Screening.

EXAM:
DIGITAL SCREENING BILATERAL MAMMOGRAM WITH TOMOSYNTHESIS AND CAD
TECHNIQUE: Bilateral screening digital craniocaudal and mediolateral oblique
mammograms were obtained. Bilateral screening digital breast
tomosynthesis was performed. The images were evaluated with
computer-aided detection.

[R CV synth-2D]
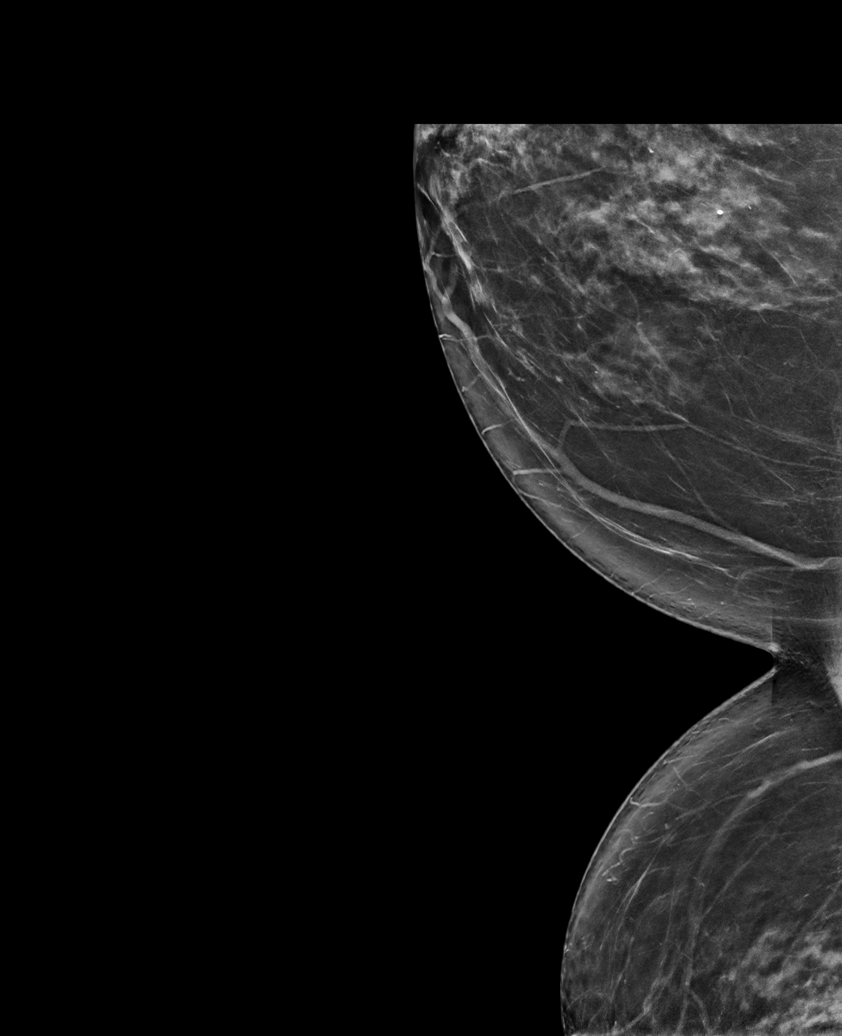

[L MLO synth-2D]
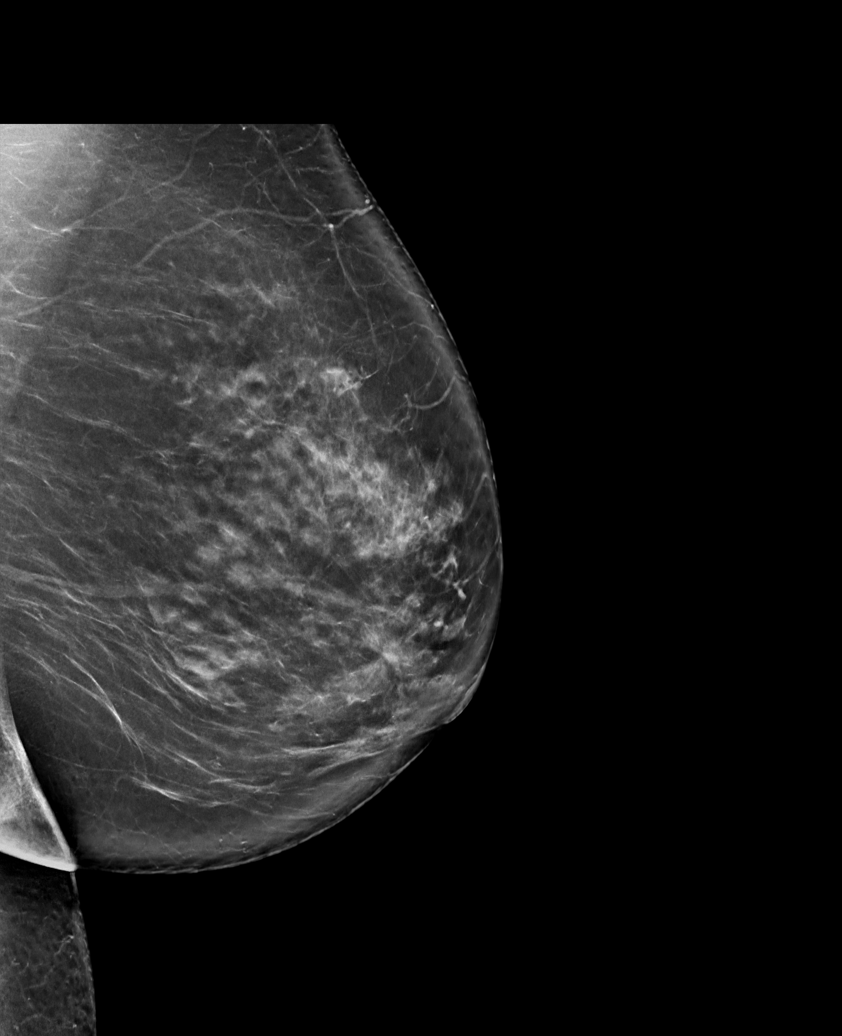

[R CC synth-2D]
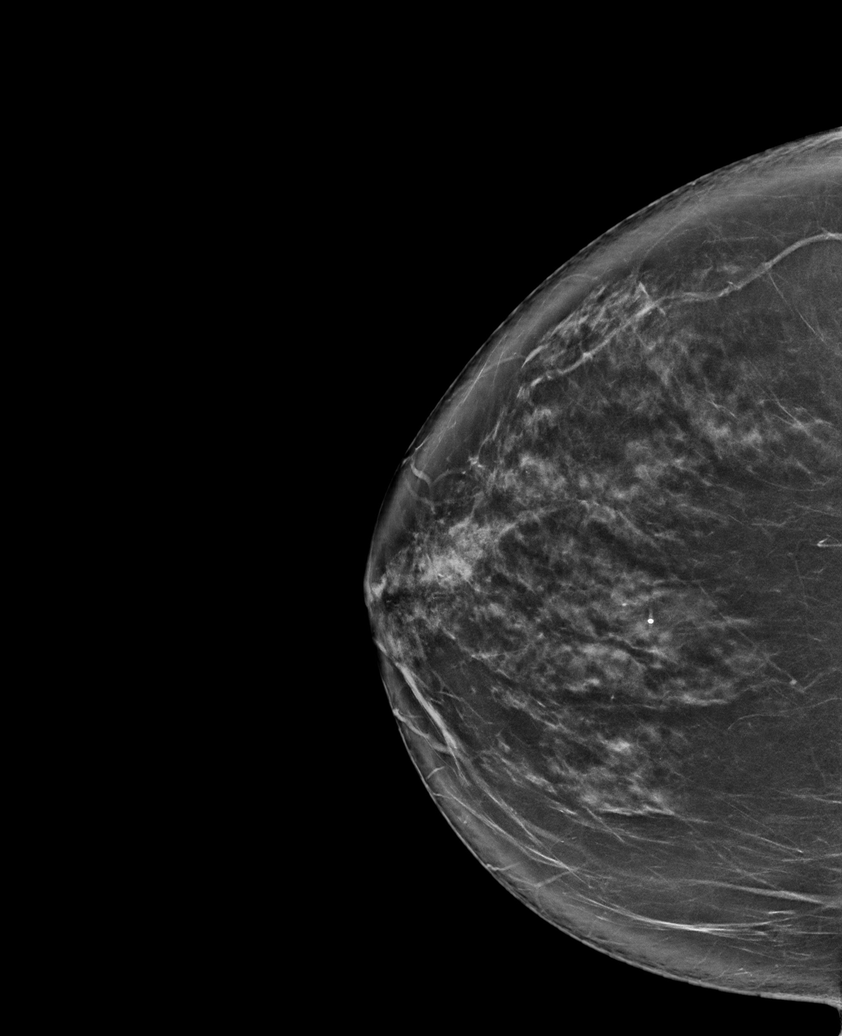

[R MLO synth-2D (1 of 2)]
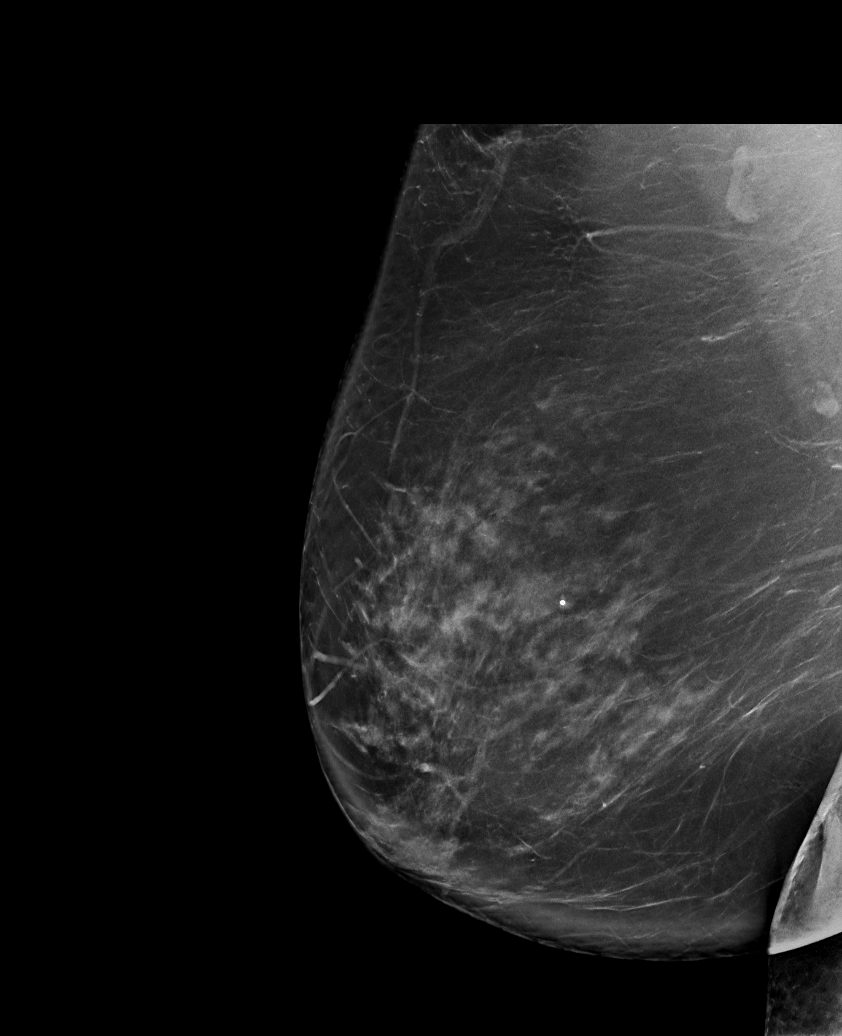

[L CC synth-2D]
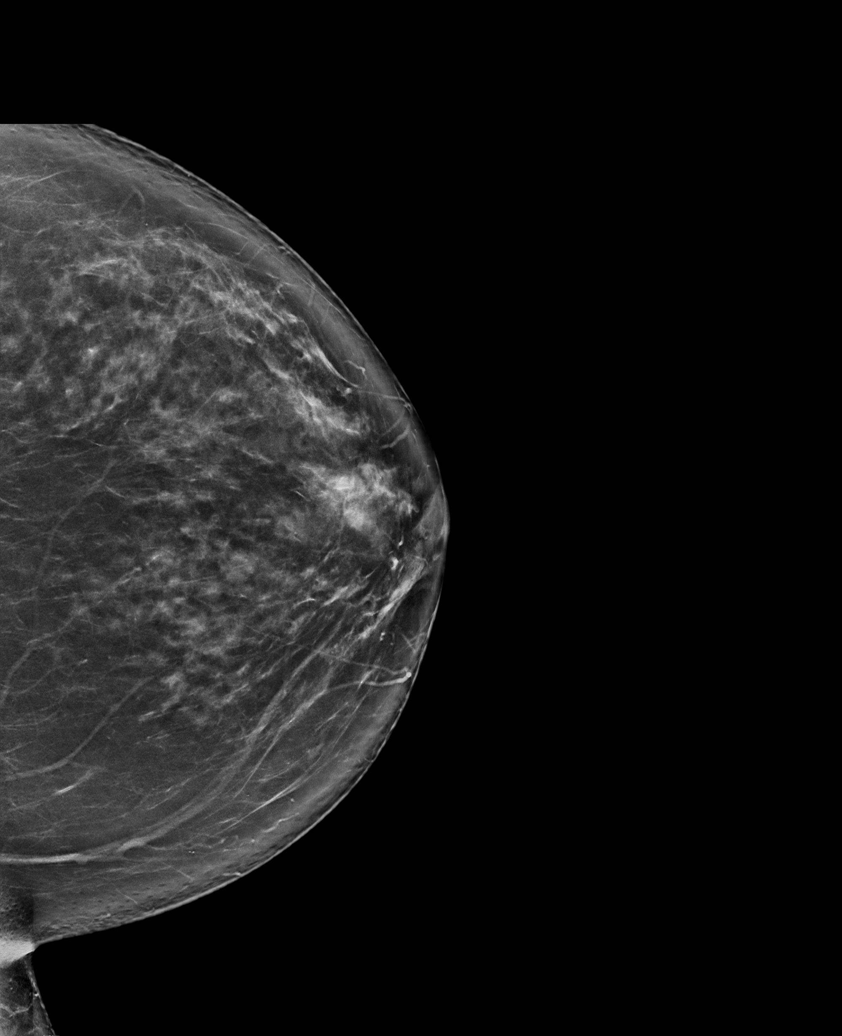

[R MLO synth-2D (2 of 2)]
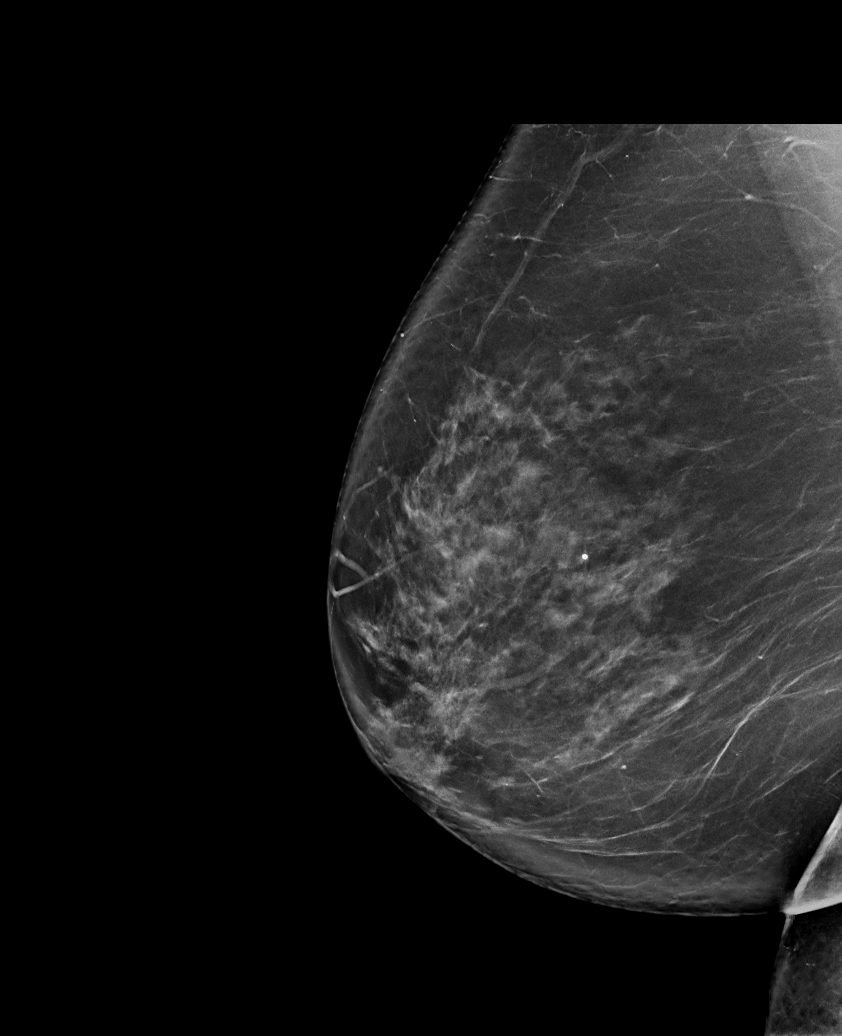

[6 of 36 positions shown; findings below may reference images not displayed]

ACR Breast Density Category c: The breast tissue is heterogeneously
dense, which may obscure small masses.
FINDINGS: There are no findings suspicious for malignancy.
IMPRESSION: No mammographic evidence of malignancy. A result letter of this
screening mammogram will be mailed directly to the patient.

RECOMMENDATION:
Screening mammogram in one year. (Code:Q3-W-BC3)

BI-RADS CATEGORY  1: Negative.

## 2022-06-29 ENCOUNTER — Ambulatory Visit
Admission: RE | Admit: 2022-06-29 | Discharge: 2022-06-29 | Disposition: A | Discharge status: Home / Self Care | Payer: Medicare Other | Source: Ambulatory Visit | Attending: Family Medicine | Admitting: Family Medicine

## 2022-06-29 ENCOUNTER — Ambulatory Visit: Payer: BC Managed Care – PPO

## 2022-06-29 DIAGNOSIS — Z1231 Encounter for screening mammogram for malignant neoplasm of breast: Secondary | ICD-10-CM

## 2023-06-11 ENCOUNTER — Other Ambulatory Visit: Payer: Self-pay | Admitting: Family Medicine

## 2023-06-11 DIAGNOSIS — Z1231 Encounter for screening mammogram for malignant neoplasm of breast: Secondary | ICD-10-CM

## 2023-07-16 ENCOUNTER — Ambulatory Visit: Payer: Medicare Other

## 2023-07-21 ENCOUNTER — Ambulatory Visit
Admission: RE | Admit: 2023-07-21 | Discharge: 2023-07-21 | Disposition: A | Discharge status: Home / Self Care | Payer: Medicare PPO | Source: Ambulatory Visit | Attending: Family Medicine | Admitting: Family Medicine

## 2023-07-21 DIAGNOSIS — Z1231 Encounter for screening mammogram for malignant neoplasm of breast: Secondary | ICD-10-CM

## 2023-08-12 DIAGNOSIS — R0981 Nasal congestion: Secondary | ICD-10-CM | POA: Diagnosis not present

## 2023-08-12 DIAGNOSIS — J40 Bronchitis, not specified as acute or chronic: Secondary | ICD-10-CM | POA: Diagnosis not present

## 2023-08-12 DIAGNOSIS — J329 Chronic sinusitis, unspecified: Secondary | ICD-10-CM | POA: Diagnosis not present

## 2023-08-20 DIAGNOSIS — M25561 Pain in right knee: Secondary | ICD-10-CM | POA: Diagnosis not present

## 2023-09-16 DIAGNOSIS — D509 Iron deficiency anemia, unspecified: Secondary | ICD-10-CM | POA: Diagnosis not present

## 2023-10-18 DIAGNOSIS — Z01419 Encounter for gynecological examination (general) (routine) without abnormal findings: Secondary | ICD-10-CM | POA: Diagnosis not present

## 2023-10-18 DIAGNOSIS — Z6833 Body mass index (BMI) 33.0-33.9, adult: Secondary | ICD-10-CM | POA: Diagnosis not present

## 2023-12-27 DIAGNOSIS — D509 Iron deficiency anemia, unspecified: Secondary | ICD-10-CM | POA: Diagnosis not present

## 2024-03-21 ENCOUNTER — Other Ambulatory Visit (HOSPITAL_BASED_OUTPATIENT_CLINIC_OR_DEPARTMENT_OTHER): Payer: Self-pay | Admitting: Family Medicine

## 2024-03-21 DIAGNOSIS — M8588 Other specified disorders of bone density and structure, other site: Secondary | ICD-10-CM | POA: Diagnosis not present

## 2024-03-21 DIAGNOSIS — R0789 Other chest pain: Secondary | ICD-10-CM

## 2024-03-21 DIAGNOSIS — E559 Vitamin D deficiency, unspecified: Secondary | ICD-10-CM | POA: Diagnosis not present

## 2024-03-21 DIAGNOSIS — I4510 Unspecified right bundle-branch block: Secondary | ICD-10-CM | POA: Diagnosis not present

## 2024-03-21 DIAGNOSIS — E669 Obesity, unspecified: Secondary | ICD-10-CM | POA: Diagnosis not present

## 2024-03-21 DIAGNOSIS — E785 Hyperlipidemia, unspecified: Secondary | ICD-10-CM

## 2024-03-21 DIAGNOSIS — Z1331 Encounter for screening for depression: Secondary | ICD-10-CM | POA: Diagnosis not present

## 2024-03-21 DIAGNOSIS — Z8249 Family history of ischemic heart disease and other diseases of the circulatory system: Secondary | ICD-10-CM

## 2024-03-21 DIAGNOSIS — Z0000 Encounter for general adult medical examination without abnormal findings: Secondary | ICD-10-CM | POA: Diagnosis not present

## 2024-03-21 DIAGNOSIS — K219 Gastro-esophageal reflux disease without esophagitis: Secondary | ICD-10-CM | POA: Diagnosis not present

## 2024-03-21 DIAGNOSIS — D509 Iron deficiency anemia, unspecified: Secondary | ICD-10-CM | POA: Diagnosis not present

## 2024-03-22 ENCOUNTER — Other Ambulatory Visit (HOSPITAL_BASED_OUTPATIENT_CLINIC_OR_DEPARTMENT_OTHER): Payer: Self-pay | Admitting: Family Medicine

## 2024-03-22 DIAGNOSIS — M8580 Other specified disorders of bone density and structure, unspecified site: Secondary | ICD-10-CM

## 2024-04-06 ENCOUNTER — Ambulatory Visit (HOSPITAL_BASED_OUTPATIENT_CLINIC_OR_DEPARTMENT_OTHER)
Admission: RE | Admit: 2024-04-06 | Discharge: 2024-04-06 | Disposition: A | Discharge status: Home / Self Care | Payer: Self-pay | Source: Ambulatory Visit | Attending: Family Medicine | Admitting: Family Medicine

## 2024-04-06 DIAGNOSIS — R0789 Other chest pain: Secondary | ICD-10-CM | POA: Insufficient documentation

## 2024-04-06 DIAGNOSIS — Z8249 Family history of ischemic heart disease and other diseases of the circulatory system: Secondary | ICD-10-CM | POA: Insufficient documentation

## 2024-04-06 DIAGNOSIS — E785 Hyperlipidemia, unspecified: Secondary | ICD-10-CM | POA: Insufficient documentation

## 2024-06-13 DIAGNOSIS — Z79899 Other long term (current) drug therapy: Secondary | ICD-10-CM | POA: Diagnosis not present

## 2024-06-13 DIAGNOSIS — E785 Hyperlipidemia, unspecified: Secondary | ICD-10-CM | POA: Diagnosis not present

## 2024-07-07 ENCOUNTER — Other Ambulatory Visit: Payer: Self-pay | Admitting: Family Medicine

## 2024-07-07 DIAGNOSIS — Z1231 Encounter for screening mammogram for malignant neoplasm of breast: Secondary | ICD-10-CM

## 2024-08-01 ENCOUNTER — Ambulatory Visit
Admission: RE | Admit: 2024-08-01 | Discharge: 2024-08-01 | Disposition: A | Source: Ambulatory Visit | Attending: Family Medicine | Admitting: Family Medicine

## 2024-08-01 DIAGNOSIS — Z1231 Encounter for screening mammogram for malignant neoplasm of breast: Secondary | ICD-10-CM | POA: Diagnosis not present
